# Patient Record
Sex: Male | Born: 1955 | Hispanic: No | Marital: Married | Smoking: Former smoker
Health system: Southern US, Community
[De-identification: ages and names within clinical notes are randomized; demographics above are authoritative.]

## PROBLEM LIST (undated history)

## (undated) DIAGNOSIS — K4090 Unilateral inguinal hernia, without obstruction or gangrene, not specified as recurrent: Secondary | ICD-10-CM

## (undated) HISTORY — DX: Unilateral inguinal hernia, without obstruction or gangrene, not specified as recurrent: K40.90

---

## 2010-10-02 ENCOUNTER — Encounter
Admission: RE | Admit: 2010-10-02 | Discharge: 2010-10-02 | Payer: Self-pay | Source: Home / Self Care | Attending: Occupational Medicine | Admitting: Occupational Medicine

## 2011-01-16 ENCOUNTER — Ambulatory Visit
Admission: RE | Admit: 2011-01-16 | Discharge: 2011-01-16 | Disposition: A | Discharge status: Home / Self Care | Payer: Worker's Compensation | Source: Ambulatory Visit | Attending: Occupational Medicine | Admitting: Occupational Medicine

## 2011-01-16 ENCOUNTER — Other Ambulatory Visit: Payer: Self-pay | Admitting: Occupational Medicine

## 2011-01-16 DIAGNOSIS — M79673 Pain in unspecified foot: Secondary | ICD-10-CM

## 2011-05-06 ENCOUNTER — Encounter: Payer: Self-pay | Admitting: Sports Medicine

## 2011-05-06 ENCOUNTER — Encounter: Payer: Worker's Compensation | Admitting: Sports Medicine

## 2011-07-10 ENCOUNTER — Ambulatory Visit
Admission: RE | Admit: 2011-07-10 | Discharge: 2011-07-10 | Disposition: A | Discharge status: Home / Self Care | Payer: No Typology Code available for payment source | Source: Ambulatory Visit | Attending: Occupational Medicine | Admitting: Occupational Medicine

## 2011-07-10 ENCOUNTER — Other Ambulatory Visit: Payer: Self-pay | Admitting: Occupational Medicine

## 2011-07-10 DIAGNOSIS — S6710XA Crushing injury of unspecified finger(s), initial encounter: Secondary | ICD-10-CM

## 2011-10-07 DIAGNOSIS — K4090 Unilateral inguinal hernia, without obstruction or gangrene, not specified as recurrent: Secondary | ICD-10-CM

## 2011-10-07 HISTORY — DX: Unilateral inguinal hernia, without obstruction or gangrene, not specified as recurrent: K40.90

## 2012-05-13 ENCOUNTER — Encounter (INDEPENDENT_AMBULATORY_CARE_PROVIDER_SITE_OTHER): Payer: Self-pay | Admitting: General Surgery

## 2012-05-13 ENCOUNTER — Ambulatory Visit (INDEPENDENT_AMBULATORY_CARE_PROVIDER_SITE_OTHER): Payer: Worker's Compensation | Admitting: General Surgery

## 2012-05-13 VITALS — BP 138/88 | HR 74 | Temp 97.7°F | Resp 18 | Ht 61.0 in | Wt 212.0 lb

## 2012-05-13 DIAGNOSIS — R1031 Right lower quadrant pain: Secondary | ICD-10-CM

## 2012-05-13 NOTE — Progress Notes (Signed)
Patient ID: Alex Hendrix, male   DOB: 1956-05-01, 56 y.o.   MRN: [id number]  No chief complaint on file.   HPI Alex Hendrix is a 56 y.o. male.    This patient was referred by Dr. Alto Denver  for evaluation of possible right inguinal hernia. He says that he hurt his groin while getting into a truck early in May and he said he thought he pulled a muscle. At that time he went to his workplace physicians and he says that she noticed a bulge in the area and referred him for hernia evaluation. Since then he has not noticed a bulge in the area has been on light duty. He says that the pain has been better but is still causing occasional discomfort which she describes as a "pain" like a pulled muscle. He does not have any discomfort while at rest but pain is worse when lifting his foot or leg. he denies any other obstructive symptoms.  HPI  Past Medical History  Diagnosis Date  . Inguinal hernia 2013    No past surgical history on file.  No family history on file.  Social History History  Substance Use Topics  . Smoking status: Former Smoker    Quit date: 10/13/1984  . Smokeless tobacco: Not on file  . Alcohol Use: No    Allergies no known allergies  Current Outpatient Prescriptions  Medication Sig Dispense Refill  . ibuprofen (ADVIL,MOTRIN) 800 MG tablet Take 800 mg by mouth every 8 (eight) hours as needed.        Review of Systems Review of Systems All other review of systems negative or noncontributory except as stated in the HPI  Blood pressure 138/88, pulse 74, temperature 97.7 F (36.5 C), temperature source Temporal, resp. rate 18, height 5\' 1"  (1.549 m), weight 212 lb (96.163 kg).  Physical Exam Physical Exam Physical Exam  Vitals reviewed. Constitutional: He is oriented to person, place, and time. He appears well-developed and well-nourished. No distress.  HENT:  Head: Normocephalic and atraumatic.  Mouth/Throat: No oropharyngeal exudate.  Eyes: Conjunctivae and EOM are  normal. Pupils are equal, round, and reactive to light. Right eye exhibits no discharge. Left eye exhibits no discharge. No scleral icterus.  Neck: Normal range of motion. No tracheal deviation present.  Cardiovascular: Normal rate, regular rhythm and normal heart sounds.   Pulmonary/Chest: Effort normal and breath sounds normal. No stridor. No respiratory distress. He has no wheezes. He has no rales. He exhibits no tenderness.  Abdominal: Soft. Bowel sounds are normal. He exhibits no distension and no mass. There is no tenderness. There is no rebound and no guarding.  No LIH.  I do not appreciate any RIH or bulge with valsalva. Testicles normal. Musculoskeletal: Normal range of motion. He exhibits no edema and no tenderness.  Neurological: He is alert and oriented to person, place, and time.  Skin: Skin is warm and dry. No rash noted. He is not diaphoretic. No erythema. No pallor.  Psychiatric: He has a normal mood and affect. His behavior is normal. Judgment and thought content normal.    Data Reviewed   Assessment    Right groin pain I'm not sure what is causing his pain at this point. I did not appreciate any inguinal hernia on exam. There is no pulsatile Valsalva. His history is concerning for possible hernia so we will plan on getting a dynamic ultrasound of the right inguinal region to further evaluate. If he has a hernia on ultrasound, given his history,  we will be more comfortable offering him surgical repair. If his old chart is negative then I would treat this as a groin strain with rest and NSAIDs and consider further work up with orthopedic surgery      Plan    We will check an ultrasound of the right groin and we'll see him back in about 3 weeks to discuss results.       Alex Hendrix 05/13/2012, 9:33 AM

## 2012-05-18 ENCOUNTER — Ambulatory Visit
Admission: RE | Admit: 2012-05-18 | Discharge: 2012-05-18 | Disposition: A | Discharge status: Home / Self Care | Payer: Worker's Compensation | Source: Ambulatory Visit | Attending: General Surgery | Admitting: General Surgery

## 2012-05-18 ENCOUNTER — Other Ambulatory Visit (INDEPENDENT_AMBULATORY_CARE_PROVIDER_SITE_OTHER): Payer: Self-pay | Admitting: General Surgery

## 2012-05-18 DIAGNOSIS — R1031 Right lower quadrant pain: Secondary | ICD-10-CM

## 2012-06-04 ENCOUNTER — Encounter (INDEPENDENT_AMBULATORY_CARE_PROVIDER_SITE_OTHER): Payer: Self-pay | Admitting: General Surgery

## 2012-06-04 ENCOUNTER — Ambulatory Visit (INDEPENDENT_AMBULATORY_CARE_PROVIDER_SITE_OTHER): Payer: 59 | Admitting: General Surgery

## 2012-06-04 VITALS — BP 140/70 | HR 62 | Temp 97.4°F | Resp 14 | Ht 71.0 in | Wt 214.1 lb

## 2012-06-04 DIAGNOSIS — R599 Enlarged lymph nodes, unspecified: Secondary | ICD-10-CM

## 2012-06-04 DIAGNOSIS — R1031 Right lower quadrant pain: Secondary | ICD-10-CM

## 2012-06-04 DIAGNOSIS — R591 Generalized enlarged lymph nodes: Secondary | ICD-10-CM

## 2012-06-04 NOTE — Progress Notes (Signed)
Subjective:     Patient ID: Alex Hendrix, male   DOB: May 14, 1956, 56 y.o.   MRN: [id number]  HPI This patient follows up for evaluation of right groin pain. He was referred to me for evaluation for possible inguinal hernia. On exam, I did not appreciate any hernia and I referred him for ultrasound of the area. This did not demonstrate any evidence of hernia but there was concern for a possible lymph node. Since then, he says that he feels better and is ready return to work. He has not noticed any bulging in the area. He denies any fevers or chills, or weight loss. He does say that he has some sweats at night but not soaking the sheets  Review of Systems     Objective:   Physical Exam On exam, I do not appreciate any inguinal hernia bilaterally again today. There is notable Valsalva. I really don't appreciate any significant or pathologic lymphadenopathy in the inguinal region as well. There is no axillary lymphadenopathy or supraclavicular or cervical lymphadenopathy as well.    Assessment:     Groin pain and inguinal lymphadenopathy This lymphadenopathy may be reactive to a local infection or other cause and maybe that was the cause of his discomfort. I do not appreciate any hernia and he says that he is feeling better although he still has some occasional discomfort with flexion. I do not appreciate any obvious lymphadenopathy on exam however, given his ultrasound findings, I recommended a CT scan of the abdomen in his pelvis to further evaluate this lymphadenopathy and to evaluate for possible lymphoma. If this is significant then we would consider biopsy or at the minimum set him up for repeat evaluation in 6 months. He is concerned about his insurance and we worked on authorization for coverage for CT scan and further workup.    Plan:     He is scheduled for CT on 06/16/12 and he will follow up after that.

## 2012-06-16 ENCOUNTER — Ambulatory Visit
Admission: RE | Admit: 2012-06-16 | Discharge: 2012-06-16 | Disposition: A | Discharge status: Home / Self Care | Payer: 59 | Source: Ambulatory Visit | Attending: General Surgery | Admitting: General Surgery

## 2012-06-16 DIAGNOSIS — R591 Generalized enlarged lymph nodes: Secondary | ICD-10-CM

## 2012-06-16 MED ORDER — IOHEXOL 300 MG/ML  SOLN
125.0000 mL | Freq: Once | INTRAMUSCULAR | Status: AC | PRN
  Administered 2012-06-16: 125 mL via INTRAVENOUS

## 2012-06-24 ENCOUNTER — Encounter (INDEPENDENT_AMBULATORY_CARE_PROVIDER_SITE_OTHER): Payer: Self-pay | Admitting: General Surgery

## 2012-06-24 ENCOUNTER — Ambulatory Visit (INDEPENDENT_AMBULATORY_CARE_PROVIDER_SITE_OTHER): Payer: 59 | Admitting: General Surgery

## 2012-06-24 VITALS — BP 138/76 | HR 64 | Temp 97.6°F | Resp 16 | Ht 72.0 in | Wt 215.0 lb

## 2012-06-24 DIAGNOSIS — R599 Enlarged lymph nodes, unspecified: Secondary | ICD-10-CM

## 2012-06-24 DIAGNOSIS — R590 Localized enlarged lymph nodes: Secondary | ICD-10-CM

## 2012-06-24 NOTE — Progress Notes (Signed)
Subjective:     Patient ID: Alex Hendrix, male   DOB: [birth year], 56 y.o.   MRN: [id number]  HPI This patient follows up status post CT scan of the pelvis to evaluate for possible inguinal lymphadenopathy. He says that his groin pain has resolved and he is back to work without any problems. He does not noticed any bulge in the groin and CT scan was negative for any pathologic lymphadenopathy or hernia. Incidentally, he did have a small right adrenal mass which was likely consistent with an adrenal adenoma.  Review of Systems     Objective:   Physical Exam No distress and nontoxic-appearing I do not appreciate any hernia in the inguinal region and there is no palpable lymphadenopathy on exam today.    Assessment:     Right groin pain-resolved Right adrenal mass His groin pain has resolved and there is no hernia on exam or on imaging. The possible lymphadenopathy in his right groin is also resolved as there was no evidence of any pathologic lymphadenopathy on CT scan. He did have a small right adrenal adenoma and I recommended that he followup with his primary care physician in 6 months for repeat CT scan or MRI to ensure that there is no pathology associated with this. I also gave him a copy of his CT scan result for his files and also to get his primary care physician in order to facilitate followup.    Plan:     He will followup with me on a when necessary basis for his groin otherwise he will follow up with his primary care physician in 6 months for a repeat CT scan or MRI of her we could also help facilitate this if necessary or if requested by his primary care physician.

## 2012-06-24 NOTE — Patient Instructions (Signed)
Follow up with your primary care physician for repeat CT scan or MRI in 6 months to evaluate adrenal gland.

## 2014-05-02 IMAGING — US US PELVIS LIMITED
1 series · 14 of 21 positions shown · non-contrast
Comparison: None.

CLINICAL DATA: Right groin pain

US PELVIS LIMITED OR FOLLOW UP

[Series 1: us pelvis limited · 0.10mm/px · 21 acquisitions, 14 frames shown]
[im 1/21]
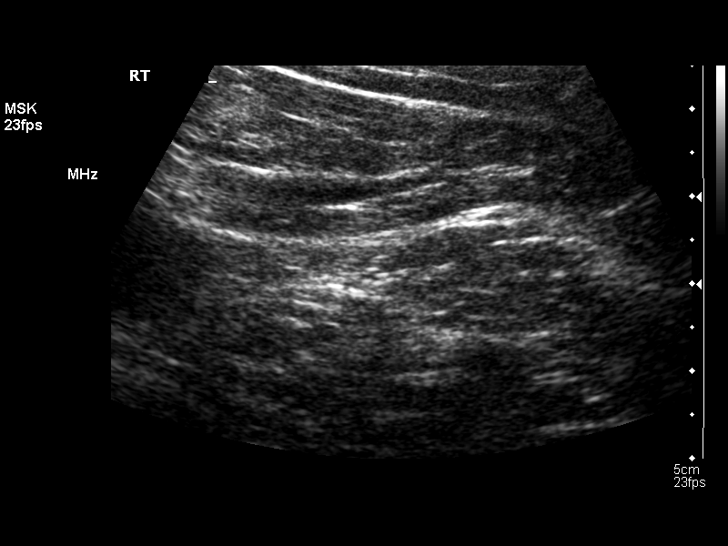
[im 3/21]
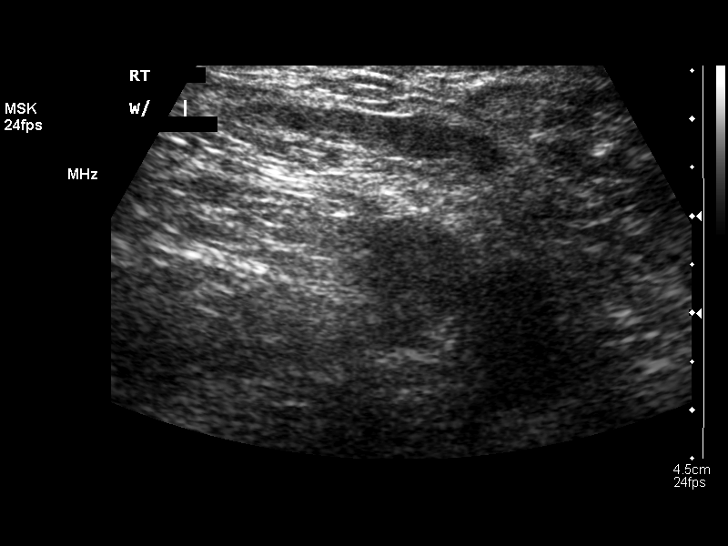
[im 4/21]
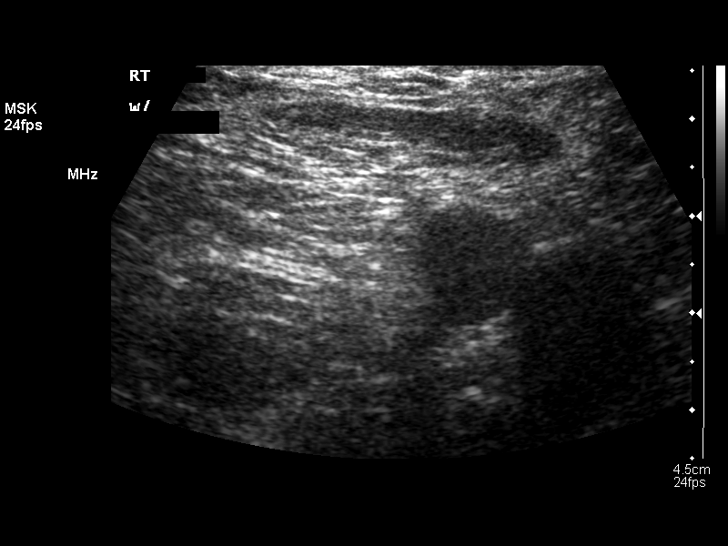
[im 6/21]
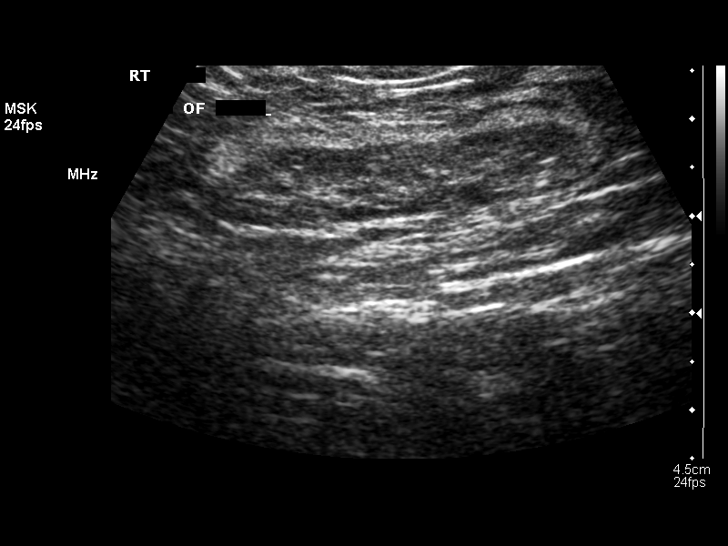
[im 7/21]
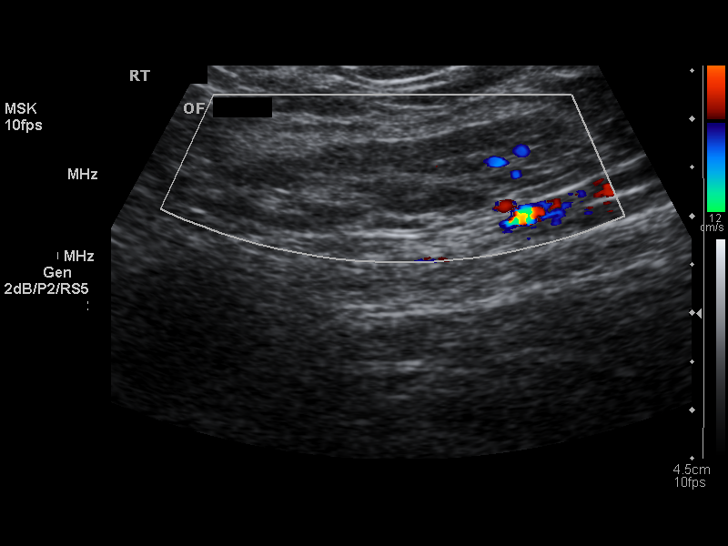
[im 9/21]
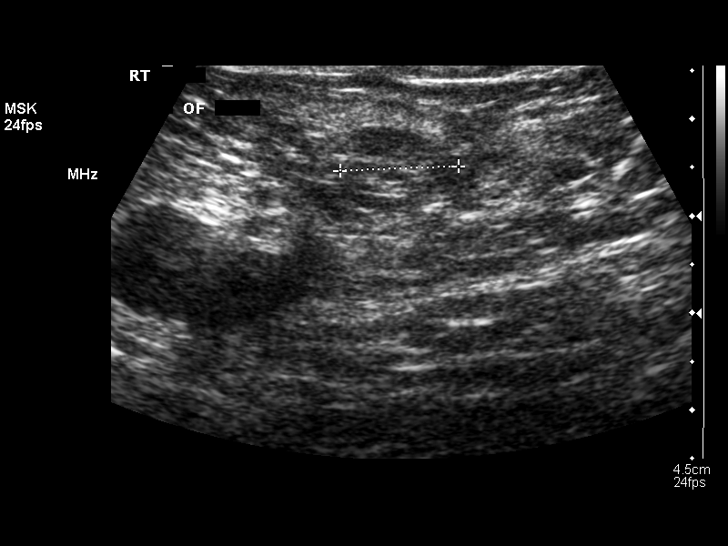
[im 10/21]
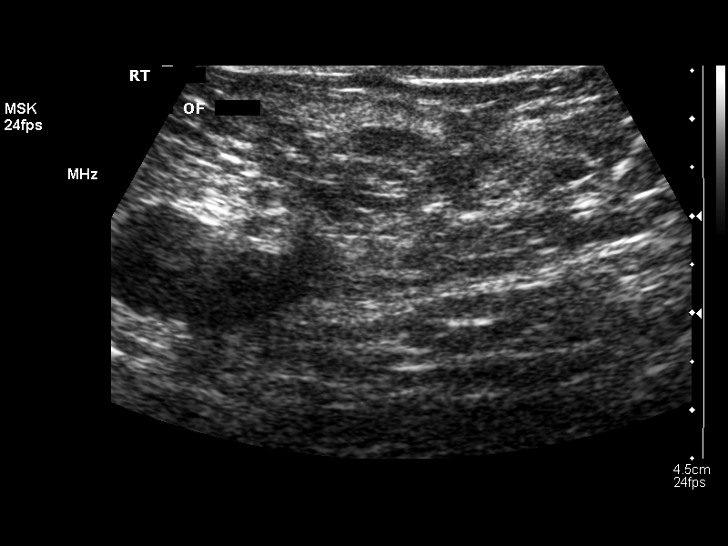
[im 12/21]
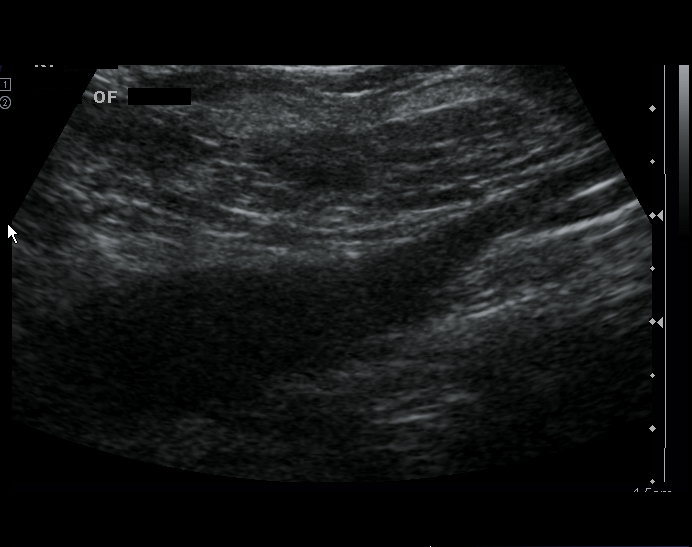
[im 13/21]
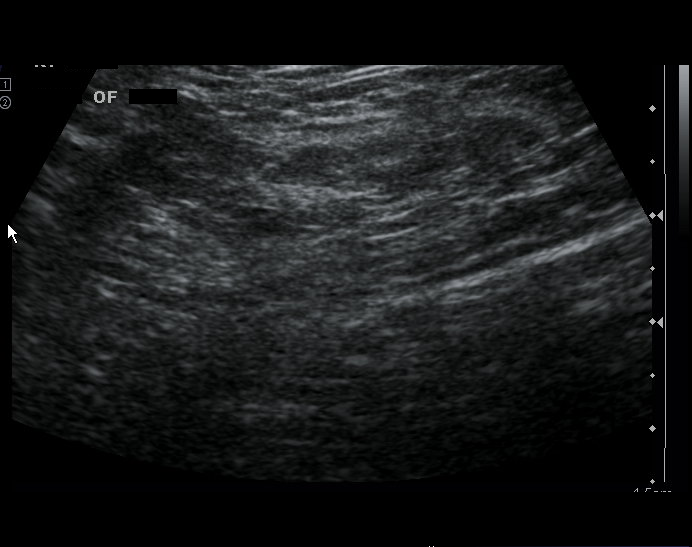
[im 15/21]
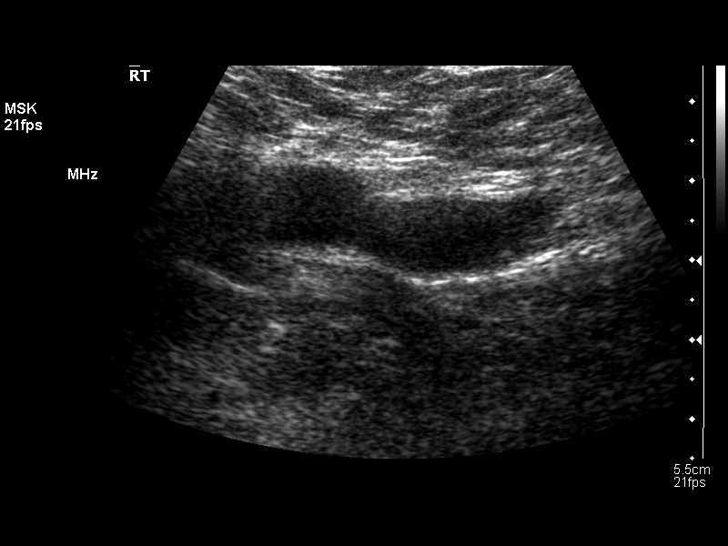
[im 16/21]
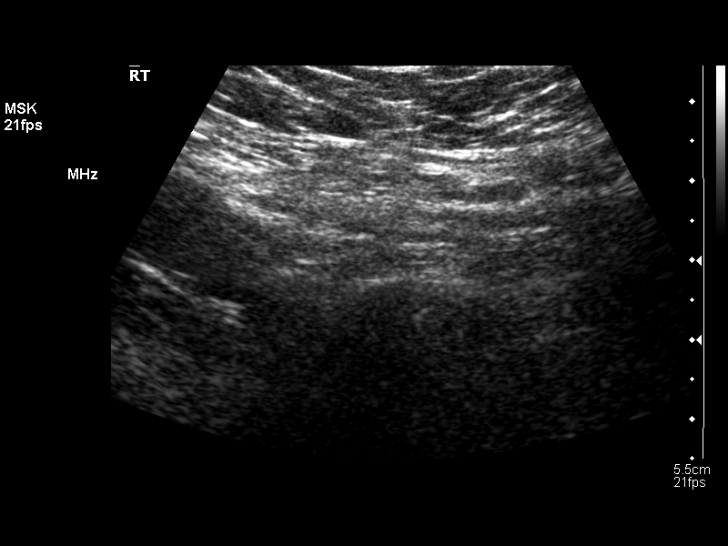
[im 18/21]
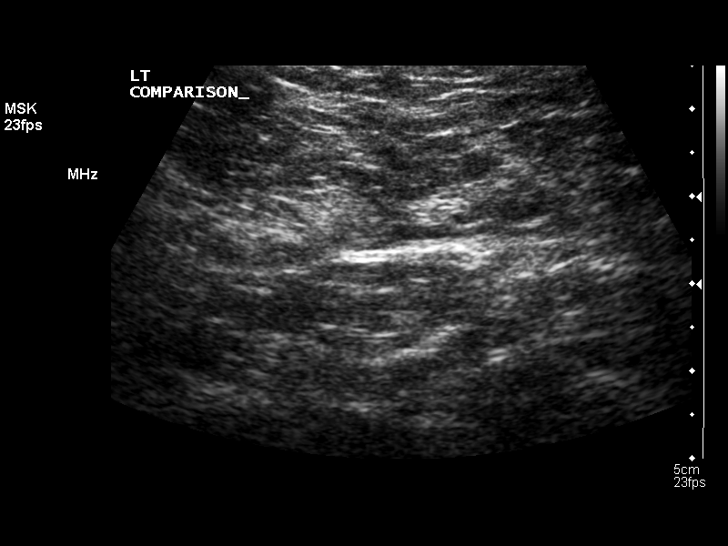
[im 19/21]
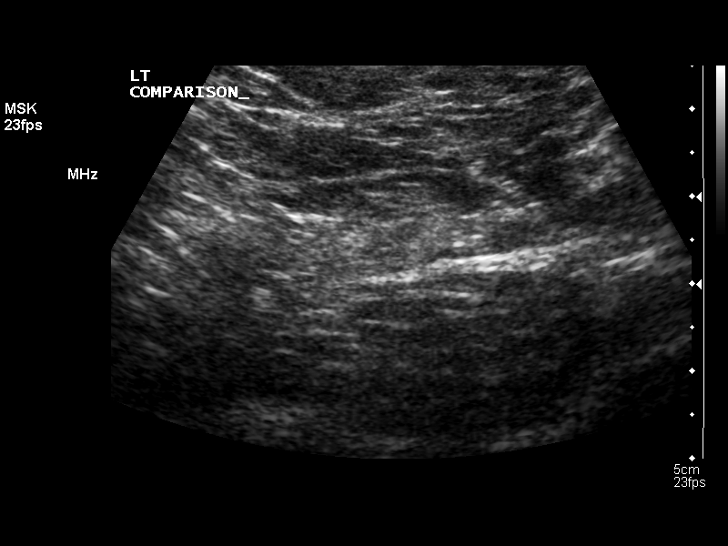
[im 21/21]
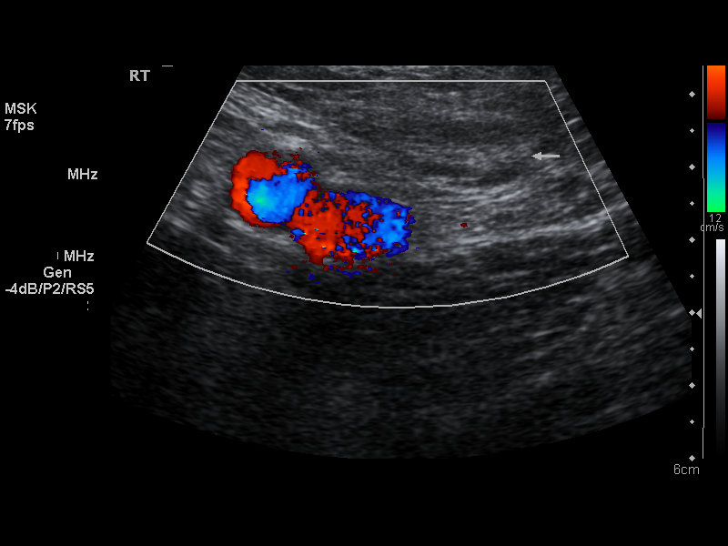

[14 of 21 positions shown; findings below may reference images not displayed]

FINDINGS: Ultrasound over the right groin was performed.  With
Valsalva, no inguinal hernia is noted.  At the area of pain there
is an oval area of soft tissue present of 4.1 x 0.8 x 1.2 cm with
some blood flow.  This may represent a lymph node, but if further
assessment is warranted, CT of the pelvis including the inguinal
regions may be helpful.
IMPRESSION: No inguinal hernia is seen.  Possible lymph node at the site in
question.

## 2016-02-25 ENCOUNTER — Ambulatory Visit
Admission: RE | Admit: 2016-02-25 | Discharge: 2016-02-25 | Disposition: A | Discharge status: Home / Self Care | Payer: Worker's Compensation | Source: Ambulatory Visit | Attending: Nurse Practitioner | Admitting: Nurse Practitioner

## 2016-02-25 ENCOUNTER — Other Ambulatory Visit: Payer: Self-pay | Admitting: Nurse Practitioner

## 2016-02-25 DIAGNOSIS — M25561 Pain in right knee: Secondary | ICD-10-CM

## 2016-05-05 ENCOUNTER — Ambulatory Visit (HOSPITAL_COMMUNITY)
Admission: EM | Admit: 2016-05-05 | Discharge: 2016-05-05 | Disposition: A | Discharge status: Home / Self Care | Payer: Commercial Managed Care - HMO | Attending: Family Medicine | Admitting: Family Medicine

## 2016-05-05 ENCOUNTER — Encounter (HOSPITAL_COMMUNITY): Payer: Self-pay | Admitting: Emergency Medicine

## 2016-05-05 DIAGNOSIS — R110 Nausea: Secondary | ICD-10-CM | POA: Diagnosis not present

## 2016-05-05 DIAGNOSIS — W57XXXA Bitten or stung by nonvenomous insect and other nonvenomous arthropods, initial encounter: Secondary | ICD-10-CM

## 2016-05-05 DIAGNOSIS — R21 Rash and other nonspecific skin eruption: Secondary | ICD-10-CM

## 2016-05-05 DIAGNOSIS — T148 Other injury of unspecified body region: Secondary | ICD-10-CM | POA: Diagnosis not present

## 2016-05-05 MED ORDER — ONDANSETRON 8 MG PO TBDP: 8.0000 mg | ORAL_TABLET | Freq: Three times a day (TID) | ORAL | 0 refills | Status: DC | PRN

## 2016-05-05 MED ORDER — DOXYCYCLINE HYCLATE 100 MG PO CAPS: 100.0000 mg | ORAL_CAPSULE | Freq: Two times a day (BID) | ORAL | 0 refills | Status: DC

## 2016-05-05 NOTE — ED Triage Notes (Signed)
Feeling lightheaded and dizzy since Wednesday.  Patient found a tick on right lower leg three weeks ago.  Patient has now noticed a rash around tick site a week ago.

## 2016-05-05 NOTE — ED Provider Notes (Signed)
CSN: [id number]     Arrival date & time 05/05/16  0240 History   None    No chief complaint on file.  (Consider location/radiation/quality/duration/timing/severity/associated sxs/prior Treatment) Patient noticed a tick on his right lower extremity last week and 2 days ago he developed rash, fatigue, and nausea.  He feels like the tick is there reason is feeling poorly and caused the rash.    Rash  Location:  Leg Quality: redness   Onset quality:  Sudden Duration:  2 days Timing:  Constant Progression:  Worsening Chronicity:  New Context: insect bite/sting   Relieved by:  Nothing Worsened by:  Nothing Ineffective treatments:  None tried Associated symptoms: fatigue and nausea     Past Medical History:  Diagnosis Date  . Inguinal hernia 2013   No past surgical history on file. No family history on file. Social History  Substance Use Topics  . Smoking status: Former Smoker    Quit date: 10/13/1984  . Smokeless tobacco: Not on file  . Alcohol use No    Review of Systems  Constitutional: Positive for fatigue.  HENT: Negative.   Eyes: Negative.   Respiratory: Negative.   Cardiovascular: Negative.   Gastrointestinal: Positive for nausea.  Endocrine: Negative.   Genitourinary: Negative.   Musculoskeletal: Negative.   Skin: Positive for rash.  Allergic/Immunologic: Negative.   Neurological: Negative.   Hematological: Negative.   Psychiatric/Behavioral: Negative.     Allergies  Iodinated diagnostic agents  Home Medications   Prior to Admission medications   Medication Sig Start Date End Date Taking? Authorizing Provider  ibuprofen (ADVIL,MOTRIN) 800 MG tablet Take 800 mg by mouth every 8 (eight) hours as needed.    Historical Provider, MD   Meds Ordered and Administered this Visit  Medications - No data to display  There were no vitals taken for this visit. No data found.   Physical Exam  Constitutional: He appears well-developed and well-nourished.   HENT:  Head: Normocephalic and atraumatic.  Right Ear: External ear normal.  Left Ear: External ear normal.  Mouth/Throat: Oropharynx is clear and moist.  Eyes: Conjunctivae and EOM are normal. Pupils are equal, round, and reactive to light.  Neck: Normal range of motion. Neck supple.  Cardiovascular: Normal rate, regular rhythm and normal heart sounds.   Pulmonary/Chest: Effort normal and breath sounds normal.  Abdominal: Soft. Bowel sounds are normal.  Skin: Rash noted.  Right calf with 4 cm diameter non raised erythematous rash    Urgent Care Course   Clinical Course    Procedures (including critical care time)  Labs Review Labs Reviewed - No data to display  Imaging Review No results found.   Visual Acuity Review  Right Eye Distance:   Left Eye Distance:   Bilateral Distance:    Right Eye Near:   Left Eye Near:    Bilateral Near:         MDM  Tick bite Rash Nausea  Zofran 8mg  one po tid prn #20 Doxycycline 100mg  one po bid x 10 days #20  Follow up with PCP if not feeling better.    Deatra Canter, FNP 05/05/16 1034

## 2017-04-24 ENCOUNTER — Other Ambulatory Visit: Payer: Self-pay | Admitting: Nurse Practitioner

## 2017-04-24 ENCOUNTER — Ambulatory Visit
Admission: RE | Admit: 2017-04-24 | Discharge: 2017-04-24 | Disposition: A | Discharge status: Home / Self Care | Payer: Worker's Compensation | Source: Ambulatory Visit | Attending: Nurse Practitioner | Admitting: Nurse Practitioner

## 2017-04-24 DIAGNOSIS — M25561 Pain in right knee: Secondary | ICD-10-CM

## 2018-02-22 ENCOUNTER — Other Ambulatory Visit: Payer: Self-pay | Admitting: Cardiology

## 2018-02-22 ENCOUNTER — Ambulatory Visit
Admission: RE | Admit: 2018-02-22 | Discharge: 2018-02-22 | Disposition: A | Discharge status: Home / Self Care | Payer: 59 | Source: Ambulatory Visit | Attending: Cardiology | Admitting: Cardiology

## 2018-02-22 DIAGNOSIS — I4819 Other persistent atrial fibrillation: Secondary | ICD-10-CM

## 2018-03-05 ENCOUNTER — Encounter: Payer: Self-pay | Admitting: Cardiology

## 2018-06-29 ENCOUNTER — Encounter: Payer: Self-pay | Admitting: Cardiology

## 2018-06-29 NOTE — Progress Notes (Unsigned)
Alex Hendrix    Date of visit:  06/29/2018 DOB:  1956/03/10    Age:  62 yrs. Medical record number:  [id number]     Account number:  [id number] Primary Care Provider: Windle Guard ____________________________ CURRENT DIAGNOSES  1. Aortic valve stenosis  2. Persistent atrial fibrillation  3. Gastro-esophageal reflux disease  4. Dyspnea ____________________________ ALLERGIES  IVP Dye, Iodine Containing, Intolerance-unknown ____________________________ CHIEF COMPLAINTS  Followup of Persistent atrial fibrillation ____________________________ HISTORY OF PRESENT ILLNESS Patient returns for cardiac followup. He continues to be able to do heavy physical work without cardiac symptoms. He was referred originally when he was found to be in atrial fibrillation but had an CHA2DS2VASC score of zero so was not anticoagulated. He has had a significant murmur and was found to have moderate severe aortic stenosis. He states that he has had a head cold for about 6 months and has had congestion but that his primary doctor would not give him antibiotics. He doesn't really have PND orthopnea and is not having angina. He has no claudication. He is unaware that he is in atrial fibrillation. ____________________________ PAST HISTORY  Past Medical Illnesses:  obesity, GERD, denies hypertension or diabetes;  Cardiovascular Illnesses:  atrial fibrillation, aortic stenosis;  Infectious Diseases:  no previous history of significant infectious diseases;  Surgical Procedures:  no previous surgical procedures;  Trauma History:  no previous history of significant trauma;  NYHA Classification:  I;  Canadian Angina Classification:  Class 0: Asymptomatic;  Cardiology Procedures-Invasive:  no previous interventional or invasive cardiology procedures;  Cardiology Procedures-Noninvasive:  echocardiogram May 2019;  Peripheral Vascular Procedures:  no previous invasive peripheral vascular procedures.;  LVEF of 60% documented via  echocardiogram on 03/05/2018,  CHADS Score:  0,  ____________________________ CARDIO-PULMONARY TEST DATES EKG Date:  02/22/2018;  Echocardiography Date: 03/05/2018;   ____________________________ FAMILY HISTORY Brother -- Seizure disorder, Alive and well Father -- Father dead, Medical history unknown Mother -- Mother dead, Cancer, Diabetes mellitus Sister -- Death of unknown cause, Sister dead ____________________________ SOCIAL HISTORY Alcohol Use:  occasionally;  Smoking:  used to smoke but quit 1986;  Diet:  regular diet;  Lifestyle:  married and 1 son;  Exercise:  no regular exercise;  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 Eyes: dry eyes Respiratory: upper respiratory infection Cardiovascular:  please review HPI  Genitourinary-Male: no dysuria, urgency, frequency, or nocturia Neurological:  denies headaches, stroke, or TIA  ____________________________ PHYSICAL EXAMINATION VITAL SIGNS  Blood Pressure:  136/68 Sitting, Left arm, regular cuff  , 130/80 Standing, Left arm and regular cuff   Pulse:  80/min. Weight:  209.00 lbs. Height:  69.00"BMI: 31  Constitutional:  pleasant white male in no acute distress, mildly obese Skin:  warm and dry to touch, no apparent skin lesions, or masses noted. Head:  normocephalic, normal hair pattern, no masses or tenderness Chest:  normal symmetry, clear to auscultation. Cardiac:  irregular rhythm, normal S1 and S2, no S3 or S4, grade 2/6 systolic murmur Extremities & Back:  no deformities, clubbing, cyanosis, erythema or edema observed. Normal muscle strength and tone. Neurological:  no gross motor or sensory deficits noted, affect appropriate, oriented x3. ____________________________ IMPRESSIONS/PLAN  1. Moderate to severe aortic stenosis  possible bicuspid valve 2. Mild dyspnea likely due to upper respiratory congestion 3. Chronic atrial fibrillation rate  controlled  Recommendations:  It was difficult to tell whether the valve is bicuspid or not previously but suspect that it may have been. He has an irregular pulse and remains in atrial fibrillation. I would like for him to have a consultation in the structural disease heart clinic in around 3 months. At that time a repeat echo could be considered. I did discuss with him that I was to be on extended medical leave at this time. ____________________________ TODAYS ORDERS  1. Return Visit: 3 months [place]                       ____________________________ Cardiology Physician:  Darden PalmerW. Spencer Alliene Klugh, Jr. MD [facility]

## 2018-06-29 NOTE — Progress Notes (Signed)
Alex Hendrix    Date of visit:  03/05/2018 DOB:  1955/10/29    Age:  61 yrs. Medical record number:  [id number]     Account number:  [id number] Primary Care Provider: Windle Guard ____________________________ CURRENT DIAGNOSES  1. Aortic valve stenosis  2. Persistent atrial fibrillation  3. Gastro-esophageal reflux disease  4. Dyspnea ____________________________ ALLERGIES  IVP Dye, Iodine Containing, Intolerance-unknown ____________________________ MEDICATIONS  1. Claritin-D 24 Hour 10 mg-240 mg tablet,extended release, 1 p.o. daily  2. Protonix 40 mg tablet,delayed release, 1 p.o. daily ____________________________ CHIEF COMPLAINTS  Followup of Persistent atrial fibrillation ____________________________ HISTORY OF PRESENT ILLNESS Patient returns for an echocardiogram in followup. He has been taking metoprolol but notes that it makes him somewhat nauseous he may not feel as well as he did before. He continues with persistent atrial fibrillation. His echocardiogram shows moderate aortic regurgitation and aortic stenosis. LV function appeared normal. On causing him carefully he can do heavy work without shortness of breath or chest discomfort. He is unaware that he is in atrial fibrillation. His CHA2DS2VASC score is quite low. ____________________________ PAST HISTORY  Past Medical Illnesses:  obesity, GERD, denies hypertension or diabetes;  Cardiovascular Illnesses:  atrial fibrillation, aortic stenosis;  Infectious Diseases:  no previous history of significant infectious diseases;  Surgical Procedures:  no previous surgical procedures;  Trauma History:  no previous history of significant trauma;  NYHA Classification:  I;  Canadian Angina Classification:  Class 0: Asymptomatic;  Cardiology Procedures-Invasive:  no previous interventional or invasive cardiology procedures;  Cardiology Procedures-Noninvasive:  echocardiogram May 2019;  Peripheral Vascular Procedures:  no previous invasive  peripheral vascular procedures.;  LVEF of 60% documented via echocardiogram on 03/05/2018,  CHADS Score:  0,  ____________________________ CARDIO-PULMONARY TEST DATES EKG Date:  02/22/2018;  Echocardiography Date: 03/05/2018;   ____________________________ FAMILY HISTORY Brother -- Seizure disorder, Alive and well Father -- Father dead, Medical history unknown Mother -- Mother dead, Cancer, Diabetes mellitus Sister -- Death of unknown cause, Sister dead ____________________________ SOCIAL HISTORY Alcohol Use:  occasionally;  Smoking:  used to smoke but quit 1986;  Diet:  regular diet;  Lifestyle:  married and 1 son;  Exercise:  no regular exercise;  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 Weight:  215.00 lbs. Height:  69.00"BMI: 31  Constitutional:  pleasant white male in no acute distress, mildly obese Skin:  warm and dry to touch, no apparent skin lesions, or masses noted. Head:  normocephalic, normal hair pattern, no masses or tenderness Chest:  normal symmetry, clear to auscultation. Cardiac:  irregular rhythm, normal S1 and S2, no S3 or S4, grade 2/6 systolic murmur Extremities & Back:  no deformities, clubbing, cyanosis, erythema or edema observed. Normal muscle strength and tone. Neurological:  no gross motor or sensory deficits noted, affect appropriate, oriented x3. ___________________________ IMPRESSIONS/PLAN  1. Aortic stenosis moderate not symptomatic 2. Persistent atrial fibrillation not symptomatic  Recommendations:  Very long discussion today about atrial fibrillation and aortic stenosis. We went over the results of his echo. He does not feel well on metoprolol and I asked him to stop taking it. He was really  not that symptomatic before and originally went to the doctor to have his ear checked and cleaned out with  the irregular pulse was noted. He is really able to quite heavy work without symptoms. He discussed the nature of aortic valve disease and could become significantly symptomatic this will need to be investigated. At the present time he is not symptomatic from his atrial fibrillation. I would recommend that he continue his current medications and I will see him back in followup in 3 months.  ____________________________ TODAYS ORDERS  1. Return Visit: 3 months                       ____________________________ Cardiology Physician:  Darden Palmer MD [facility]

## 2018-06-29 NOTE — Progress Notes (Signed)
Alex Hendrix    Date of visit:  02/22/2018 DOB:  [birth year]    Age:  62 yrs. Medical record number:  [id number]     Account number:  [id number] Primary Care Provider: Windle Guard ____________________________ CURRENT DIAGNOSES  1. Persistent atrial fibrillation  2. Dyspnea  3. Murmur  4. Gastro-esophageal reflux disease ____________________________ ALLERGIES  IVP Dye, Iodine Containing, Intolerance-unknown ____________________________ MEDICATIONS  1. Claritin-D 24 Hour 10 mg-240 mg tablet,extended release, 1 p.o. daily  2. metoprolol tartrate 25 mg tablet, BID PRN  3. Protonix 40 mg tablet,delayed release, 1 p.o. daily ____________________________ CHIEF COMPLAINTS  atrial fibrillation ____________________________ HISTORY OF PRESENT ILLNESS This very nice 62 year male was seen for evaluation of atrial fibrillation. The patient recently as noted coughing as well as some very vague dyspnea. He was seen at Dr. Jeannetta Nap office by the extender and was noted to be in atrial fibrillation. He noted some hoarseness and change in his voice and was placed on pantoprazole and also is treated with metoprolol. He may have felt somewhat better since then. 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 He never recalls being told of a murmur. He denies angina and has no PND, orthopnea or edema. He denies any history of hypertension. He does not have any claudication and there is no family history of premature cardiac disease. There is no history of rheumatic fever. No angina or chest pain suggestive of angina. ____________________________ PAST HISTORY  Past Medical Illnesses:  obesity, GERD, denies hypertension or diabetes;  Cardiovascular Illnesses:  atrial fibrillation;  Infectious Diseases:  no previous history of significant infectious diseases;  Surgical  Procedures:  no previous surgical procedures;  Trauma History:  no previous history of significant trauma;  NYHA Classification:  I;  Canadian Angina Classification:  Class 0: Asymptomatic;  Cardiology Procedures-Invasive:  no previous interventional or invasive cardiology procedures;  Cardiology Procedures-Noninvasive:  no previous non-invasive cardiovascular testing;  Peripheral Vascular Procedures:  no previous invasive peripheral vascular procedures.;  LVEF not documented,  CHADS Score:  0,  ____________________________ CARDIO-PULMONARY TEST DATES EKG Date:  02/22/2018;   ____________________________ FAMILY HISTORY Brother -- Seizure disorder, Alive and well Father -- Father dead, Medical history unknown Mother -- Mother dead, Cancer, Diabetes mellitus Sister -- Death of unknown cause, Sister dead ____________________________ SOCIAL HISTORY Alcohol Use:  occasionally;  Smoking:  used to smoke but quit 1986;  Diet:  regular diet;  Lifestyle:  married and 1 son;  Exercise:  no regular exercise;  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  Integumentary:no rashes or new skin lesions. Eyes: dry eyes Ears, Nose, Throat, Mouth:  denies any hearing loss, epistaxis, hoarseness or difficulty speaking. Respiratory: dyspnea, cough Cardiovascular:  please review HPI Abdominal: denies dyspepsia, GI bleeding, constipation, or diarrhea Genitourinary-Male: no dysuria, urgency, frequency, or nocturia  Musculoskeletal:  denies arthritis, venous insufficiency, or muscle weakness Neurological:  denies headaches, stroke, or TIA Psychiatric:  denies depression or anxiety Hematological/Immunologic:  denies any food allergies, bleeding disorders. ____________________________ PHYSICAL EXAMINATION VITAL SIGNS  Blood  Pressure:  128/66 Sitting, Left arm, large cuff  , 130/70 Standing, Left arm and large cuff  Pulse:  72/min. Weight:  218.00 lbs. Height:  69"BMI: 32  Constitutional:  pleasant white male in no acute distress, mildly obese Skin:  warm and dry to touch, no apparent skin lesions, or masses noted. Head:  normocephalic, normal hair pattern, no masses or tenderness Eyes:  EOMS Intact, PERRLA, C and S clear, Funduscopic exam not done. ENT:  ears, nose and throat reveal no gross abnormalities.  Dentition good. Neck:  supple, without massess. No JVD, thyromegaly or carotid bruits. Carotid upstroke normal. Chest:  normal symmetry, clear to auscultation. Cardiac:  irregular rhythm, normal S1 and S2, no S3 or S4, grade 2/6 systolic murmur Abdomen:  abdomen soft,non-tender, no masses, no hepatospenomegaly, or aneurysm noted Peripheral Pulses:  the femoral,dorsalis pedis, and posterior tibial pulses are full and equal bilaterally with no bruits auscultated. Extremities & Back:  no deformities, clubbing, cyanosis, erythema or edema observed. Normal muscle strength and tone. Neurological:  no gross motor or sensory deficits noted, affect appropriate, oriented x3. ___________________________ IMPRESSIONS/PLAN  1. Persistent atrial fibrillation of unknown duration-his CHA2DS2VASC score is currently zero 2. Systolic heart murmur possible mitral murmur 3. Obesity  Recommendations:  He has a fairly loud systolic murmur that may suggest a murmur of mitral regurgitation. His EKG personally reviewed by me shows atrial fibrillation with controlled response there T wave changes in the lateral leads consistent with LVH or possibly ischemia. My recommendations would be for him to have an echocardiogram to assess his chamber size and also to evaluate the cardiac murmur. Following review the echo we will have further recommendations. At the present time because of a low CHA2DS2VASC score I do not think he needs to be  anticoagulated. I would like him to have a chest x-ray to assess cardiac size. Thank you for asking me to see him with you. ____________________________ TODAYS ORDERS  1. 2D, color flow, doppler: First Available  2. 12 Lead EKG: Today  3. Return with ECHO:   4. Chest X-ray PA/Lat: today                       ____________________________ Cardiology Physician:  Darden Palmer MD [facility]

## 2018-10-21 ENCOUNTER — Ambulatory Visit: Payer: 59 | Admitting: Cardiovascular Disease

## 2018-10-21 ENCOUNTER — Encounter: Payer: Self-pay | Admitting: Cardiovascular Disease

## 2018-10-21 ENCOUNTER — Encounter (INDEPENDENT_AMBULATORY_CARE_PROVIDER_SITE_OTHER): Payer: Self-pay

## 2018-10-21 VITALS — BP 142/90 | HR 71 | Ht 72.0 in | Wt 209.8 lb

## 2018-10-21 DIAGNOSIS — I350 Nonrheumatic aortic (valve) stenosis: Secondary | ICD-10-CM | POA: Diagnosis not present

## 2018-10-21 DIAGNOSIS — I4819 Other persistent atrial fibrillation: Secondary | ICD-10-CM

## 2018-10-21 NOTE — Progress Notes (Signed)
Cardiology Office Note:    Date:  10/22/2018   ID:  Alex Hendrix, DOB [birth year], MRN [id number]  PCP:  Kaleen Mask, MD  Cardiologist:  No primary care provider on file.  Electrophysiologist:  None   Referring MD: Othella Boyer, MD   Chief Complaint  Patient presents with  . Aortic Stenosis    History of Present Illness:    Alex Hendrix is a 63 y.o. male with a hx of atrial fibrillation and aortic stenosis, referred for evaluation by Dr Donnie Aho. He has been in atrial fibrillation managed with observation. His CHADS-Vasc = 0 so he has not been chronically anticoagulated. He has been noted to have 'moderate-severe' aortic stenosis possibly secondary to a bicuspid Ao valve.   He is here alone today. 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 He is able to hard work without clear exertional symptoms. He denies chest pain or pressure. Occasionally has shortness of breath with heavy exertion. States he can't work 'as long' as he used to be able to. He occasionally has dizziness with work as well, but associates this with bending down and picking things up.   He doesn't know how long he's had a heart murmur, but hadn't heard about it until he was referred to Dr Donnie Aho for atrial fibrillation last year. States he was put on an anticoagulant last year before being referred to Dr Donnie Aho. He wasn't able to tolerate this secondary to a multitude of side effects.   The patient has no family history of valvular heart disease or congenital heart disease.  He has no history of rheumatic fever.  Past Medical History:  Diagnosis Date  . Inguinal hernia 2013    History reviewed. No pertinent surgical history.  Current Medications: Current Meds  Medication Sig  . ibuprofen (ADVIL,MOTRIN) 800 MG tablet Take 800 mg by mouth every 8 (eight) hours as needed.     Allergies:   Iodinated diagnostic agents   Social History   Socioeconomic History  . Marital status:  Married    Spouse name: Not on file  . Number of children: Not on file  . Years of education: Not on file  . Highest education level: Not on file  Occupational History  . Not on file  Social Needs  . Financial resource strain: Not on file  . Food insecurity:    Worry: Not on file    Inability: Not on file  . Transportation needs:    Medical: Not on file    Non-medical: Not on file  Tobacco Use  . Smoking status: Former Smoker    Last attempt to quit: 10/13/1984    Years since quitting: 34.0  Substance and Sexual Activity  . Alcohol use: No  . Drug use: No  . Sexual activity: Not on file  Lifestyle  . Physical activity:    Days per week: Not on file    Minutes per session: Not on file  . Stress: Not on file  Relationships  . Social connections:    Talks on phone: Not on file    Gets together: Not on file    Attends religious service: Not on file    Active member of club or organization: Not on file    Attends meetings of clubs or organizations: Not on file    Relationship status: Not on file  Other Topics Concern  . Not on file  Social History Narrative  . Not on file  Family History: The patient's includes diabetes in his mother. No heart valve disease. No CAD.  ROS:   Please see the history of present illness.    All other systems reviewed and are negative.  EKGs/Labs/Other Studies Reviewed:    The following studies were reviewed today: Echo not currently available  EKG:  EKG is ordered today.  The ekg ordered today demonstrates atrial fibrillation 71 bpm, voltage criteria for LVH, nonspecific ST change  Recent Labs: No results found for requested labs within last 8760 hours.  Recent Lipid Panel No results found for: CHOL, TRIG, HDL, CHOLHDL, VLDL, LDLCALC, LDLDIRECT  Physical Exam:    VS:  BP (!) 142/90   Pulse 71   Ht 6' (1.829 m)   Wt 209 lb 12.8 oz (95.2 kg)   SpO2 98%   BMI 28.45 kg/m     Wt Readings from Last 3 Encounters:  10/21/18 209  lb 12.8 oz (95.2 kg)  06/24/12 215 lb (97.5 kg)  06/04/12 214 lb 2 oz (97.1 kg)     GEN:  Well nourished, well developed in no acute distress HEENT: Normal NECK: No JVD; No carotid bruits LYMPHATICS: No lymphadenopathy CARDIAC: Irregularly irregular, grade 3/6 mid peaking harsh systolic crescendo decrescendo murmur best heard at the right upper sternal border, grade 1/6 diastolic decrescendo murmur best heard at the left lower sternal border. RESPIRATORY:  Clear to auscultation without rales, wheezing or rhonchi  ABDOMEN: Soft, non-tender, non-distended MUSCULOSKELETAL:  No edema; No deformity  SKIN: Warm and dry NEUROLOGIC:  Alert and oriented x 3 PSYCHIATRIC:  Normal affect   ASSESSMENT:    1. Aortic valve stenosis, etiology of cardiac valve disease unspecified   2. Persistent atrial fibrillation    PLAN:    In order of problems listed above:  1. The patient appears to have moderate to severe, stage C, aortic stenosis.  He does not appear to have any cardinal symptoms of aortic stenosis at this time.  His physical exam is suggestive of moderate aortic stenosis and likely mild aortic insufficiency.  Will check a 2D echocardiogram to further evaluate this patient's aortic valve disease.  Considering his age, he is likely to have a bicuspid aortic valve.  We discussed the natural history of aortic stenosis at length today.  He understands treatment options could include ongoing surveillance, surgical aortic valve replacement, or transcatheter aortic valve replacement.  I suspect we will be able to monitor him for a period of time based on his lack of symptoms and physical exam findings.  All of his questions are answered today.  We will follow-up once his echocardiogram is completed. 2. The patient is in atrial fibrillation today.  He does not require any medical therapy.  His Chads-Vasc score is 0.   Medication Adjustments/Labs and Tests Ordered: Current medicines are reviewed at  length with the patient today.  Concerns regarding medicines are outlined above.  Orders Placed This Encounter  Procedures  . EKG 12-Lead  . ECHOCARDIOGRAM COMPLETE   No orders of the defined types were placed in this encounter.   Patient Instructions  Medication Instructions:  Your provider recommends that you continue on your current medications as directed. Please refer to the Current Medication list given to you today.    Labwork: None  Testing/Procedures: Your physician has requested that you have an echocardiogram. Echocardiography is a painless test that uses sound waves to create images of your heart. It provides your doctor with information about the size and shape of  your heart and how well your heart's chambers and valves are working. This procedure takes approximately one hour. There are no restrictions for this procedure. Your echo is scheduled Monday, October 25, 2018. Please arrive at 7:00AM.  Follow-Up: You have an appointment scheduled with Dr. Earmon Phoenixooper's assistant, Tereso NewcomerScott Weaver, on April 22, 2019 at 9:45AM.      Signed, Tonny BollmanMichael Javayah Magaw, MD  10/22/2018 1:47 PM    [facility]

## 2018-10-21 NOTE — Patient Instructions (Addendum)
Medication Instructions:  Your provider recommends that you continue on your current medications as directed. Please refer to the Current Medication list given to you today.    Labwork: None  Testing/Procedures: Your physician has requested that you have an echocardiogram. Echocardiography is a painless test that uses sound waves to create images of your heart. It provides your doctor with information about the size and shape of your heart and how well your heart's chambers and valves are working. This procedure takes approximately one hour. There are no restrictions for this procedure. Your echo is scheduled Monday, October 25, 2018. Please arrive at 7:00AM.  Follow-Up: You have an appointment scheduled with Dr. Earmon Phoenix assistant, Tereso Newcomer, on April 22, 2019 at 9:45AM.

## 2018-10-22 ENCOUNTER — Encounter: Payer: Self-pay | Admitting: Cardiovascular Disease

## 2018-10-25 ENCOUNTER — Other Ambulatory Visit: Payer: Self-pay

## 2018-10-25 ENCOUNTER — Ambulatory Visit (HOSPITAL_COMMUNITY): Payer: 59 | Attending: Cardiology

## 2018-10-25 DIAGNOSIS — I350 Nonrheumatic aortic (valve) stenosis: Secondary | ICD-10-CM | POA: Insufficient documentation

## 2018-10-28 ENCOUNTER — Telehealth: Payer: Self-pay

## 2018-10-28 DIAGNOSIS — I350 Nonrheumatic aortic (valve) stenosis: Secondary | ICD-10-CM

## 2018-10-28 NOTE — Telephone Encounter (Signed)
Reviewed results with patient who verbalized understanding.    Instructed patient to keep appointment with Alex Hendrix in July.  He understands he will be called to schedule 1 year echo and visit with [name] for next January.  He was grateful for call and agrees with treatment plan.

## 2018-10-28 NOTE — Telephone Encounter (Signed)
-----   Message from Tonny Bollman, MD sent at 10/26/2018  2:29 PM EST ----- Study reviewed and consistent with his lack of symptoms and physical exam findings. Would continue with Q6 month follow-up (APP next visit) and one year echo surveillance (when he sees me back in follow-up)

## 2019-01-24 ENCOUNTER — Encounter: Payer: Self-pay | Admitting: Cardiology

## 2019-04-11 ENCOUNTER — Telehealth: Payer: Self-pay | Admitting: *Deleted

## 2019-04-11 NOTE — Telephone Encounter (Signed)
Lvm to call back about appointment r/s to sooner

## 2019-04-20 ENCOUNTER — Telehealth: Payer: Self-pay | Admitting: Physician Assistant

## 2019-04-20 NOTE — Telephone Encounter (Signed)
New Message    Left message To confirm appt and answer COVID questions

## 2019-04-22 ENCOUNTER — Ambulatory Visit: Payer: 59 | Admitting: Physician Assistant

## 2019-04-25 ENCOUNTER — Ambulatory Visit: Payer: 59 | Admitting: Physician Assistant

## 2019-07-20 ENCOUNTER — Telehealth: Payer: Self-pay

## 2019-07-20 NOTE — Telephone Encounter (Signed)
Called to arrange 1 year echo and office visit with Dr. Burt Knack in late Janaury 2021. Left message to call back.

## 2019-07-29 NOTE — Telephone Encounter (Signed)
Left message to call back  

## 2019-08-12 NOTE — Telephone Encounter (Signed)
Left message to call back to arrange echo and visit with Dr. Burt Knack in January.  Letter sent to patient to contact office.

## 2023-07-08 ENCOUNTER — Other Ambulatory Visit: Payer: Self-pay | Admitting: Adult Health Nurse Practitioner

## 2023-07-08 ENCOUNTER — Ambulatory Visit
Admission: RE | Admit: 2023-07-08 | Discharge: 2023-07-08 | Disposition: A | Discharge status: Home / Self Care | Payer: Medicare HMO | Source: Ambulatory Visit | Attending: Adult Health Nurse Practitioner | Admitting: Adult Health Nurse Practitioner

## 2023-07-08 ENCOUNTER — Other Ambulatory Visit: Payer: Self-pay | Admitting: Family Medicine

## 2023-07-08 DIAGNOSIS — M25612 Stiffness of left shoulder, not elsewhere classified: Secondary | ICD-10-CM

## 2023-07-08 DIAGNOSIS — M25512 Pain in left shoulder: Secondary | ICD-10-CM

## 2023-07-08 DIAGNOSIS — R0602 Shortness of breath: Secondary | ICD-10-CM
